# Patient Record
Sex: Male | Born: 1980 | Race: White | Hispanic: No | Marital: Single | Smoking: Never smoker
Health system: Southern US, Community
[De-identification: ages and names within clinical notes are randomized; demographics above are authoritative.]

---

## 2017-02-01 ENCOUNTER — Other Ambulatory Visit: Payer: Self-pay

## 2017-02-01 ENCOUNTER — Encounter (HOSPITAL_COMMUNITY): Payer: Self-pay | Admitting: Emergency Medicine

## 2017-02-01 ENCOUNTER — Emergency Department (HOSPITAL_COMMUNITY): Payer: Self-pay

## 2017-02-01 ENCOUNTER — Emergency Department (HOSPITAL_COMMUNITY)
Admission: EM | Admit: 2017-02-01 | Discharge: 2017-02-01 | Disposition: A | Discharge status: Home / Self Care | Payer: Self-pay | Attending: Emergency Medicine | Admitting: Emergency Medicine

## 2017-02-01 DIAGNOSIS — X509XXA Other and unspecified overexertion or strenuous movements or postures, initial encounter: Secondary | ICD-10-CM | POA: Insufficient documentation

## 2017-02-01 DIAGNOSIS — Y92002 Bathroom of unspecified non-institutional (private) residence single-family (private) house as the place of occurrence of the external cause: Secondary | ICD-10-CM | POA: Insufficient documentation

## 2017-02-01 DIAGNOSIS — M79602 Pain in left arm: Secondary | ICD-10-CM | POA: Insufficient documentation

## 2017-02-01 DIAGNOSIS — Y93E1 Activity, personal bathing and showering: Secondary | ICD-10-CM | POA: Insufficient documentation

## 2017-02-01 DIAGNOSIS — Y999 Unspecified external cause status: Secondary | ICD-10-CM | POA: Insufficient documentation

## 2017-02-01 LAB — BASIC METABOLIC PANEL
Anion gap: 13 (ref 5–15)
BUN: 15 mg/dL (ref 6–20)
CO2: 22 mmol/L (ref 22–32)
Calcium: 10.1 mg/dL (ref 8.9–10.3)
Chloride: 101 mmol/L (ref 101–111)
Creatinine, Ser: 1.18 mg/dL (ref 0.61–1.24)
GFR calc Af Amer: >60 mL/min (ref >60)
GFR calc non Af Amer: >60 mL/min (ref >60)
Glucose, Bld: 115 mg/dL — ABNORMAL HIGH (ref 65–99)
Potassium: 4.2 mmol/L (ref 3.5–5.1)
Sodium: 136 mmol/L (ref 135–145)

## 2017-02-01 LAB — I-STAT TROPONIN, ED: Troponin i, poc: 0 ng/mL (ref 0.00–0.08)

## 2017-02-01 LAB — CBC
HCT: 48.5 % (ref 39.0–52.0)
Hemoglobin: 17 g/dL (ref 13.0–17.0)
MCH: 29.3 pg (ref 26.0–34.0)
MCHC: 35.1 g/dL (ref 30.0–36.0)
MCV: 83.5 fL (ref 78.0–100.0)
Platelets: 226 10*3/uL (ref 150–400)
RBC: 5.81 MIL/uL (ref 4.22–5.81)
RDW: 12.7 % (ref 11.5–15.5)
WBC: 14.6 10*3/uL — ABNORMAL HIGH (ref 4.0–10.5)

## 2017-02-01 NOTE — ED Provider Notes (Signed)
[facility]GENC[facility] Provider Note   CSN: [id number] Arrival date & time: 02/01/17  1339     History   Chief Complaint Chief Complaint  Patient presents with  . Arm Pain    HPI Duwayne Heckndrew Sarria is a 36 y.o. male.  HPI   36 year old male presents today with complaints of arm pain.  Patient reports a tightness in his left arm that has been constant over the last 3 days.  Patient reports that he was in the shower, washing when he felt a tightness in the shoulder and arm, with radiation to the back.  He denies any associated chest pain, shortness of breath, nausea.  Patient notes history of the same several years ago where he saw cardiologist and had a stress test that showed no significant findings.  He denies any significant personal cardiac history, history DVT or PE or any significant risk factors.  Patient denies any drug or alcohol use.  Patient notes the symptoms usually come when he is not getting enough rest, start with pain in the arms and then tightness in the arms.  He notes he does have tightness in the other arm occasionally as well.  The patient denies indigestion, denies any worsening with food.   History reviewed. No pertinent past medical history.  There are no active problems to display for this patient.   History reviewed. No pertinent surgical history.     Home Medications    Prior to Admission medications   Not on File    Family History No family history on file.  Social History Social History   Tobacco Use  . Smoking status: Never Smoker  . Smokeless tobacco: Never Used  Substance Use Topics  . Alcohol use: No    Frequency: Never  . Drug use: No     Allergies   Patient has no known allergies.   Review of Systems Review of Systems  All other systems reviewed and are negative.    Physical Exam Updated Vital Signs BP 132/85 (BP Location: Right Arm)   Pulse 78   Temp 97.9 F (36.6 C) (Oral)   Resp 16    SpO2 100%   Physical Exam  Constitutional: He is oriented to person, place, and time. He appears well-developed and well-nourished.  HENT:  Head: Normocephalic and atraumatic.  Eyes: Conjunctivae are normal. Pupils are equal, round, and reactive to light. Right eye exhibits no discharge. Left eye exhibits no discharge. No scleral icterus.  Neck: Normal range of motion. No JVD present. No tracheal deviation present.  Cardiovascular: Normal rate, regular rhythm, normal heart sounds and intact distal pulses. Exam reveals no gallop and no friction rub.  No murmur heard. Pulmonary/Chest: Effort normal and breath sounds normal. No stridor. No respiratory distress. He has no wheezes. He has no rales. He exhibits no tenderness.  Chest nontender to palpation  Abdominal: Soft. He exhibits no distension and no mass. There is no tenderness. There is no rebound and no guarding. No hernia.  Musculoskeletal:  Full active range of motion of bilateral upper extremities, nontender to palpation strength 5 out of 5, sensation intact.  Neurological: He is alert and oriented to person, place, and time. Coordination normal.  Psychiatric: He has a normal mood and affect. His behavior is normal. Judgment and thought content normal.  Nursing note and vitals reviewed.    ED Treatments / Results  Labs (all labs ordered are listed, but only abnormal results are displayed) Labs Reviewed  BASIC  METABOLIC PANEL - Abnormal; Notable for the following components:      Result Value   Glucose, Bld 115 (*)    All other components within normal limits  CBC - Abnormal; Notable for the following components:   WBC 14.6 (*)    All other components within normal limits  I-STAT TROPONIN, ED    EKG  EKG Interpretation None       Radiology Dg Chest 2 View  Result Date: 02/01/2017 CLINICAL DATA:  Left-sided chest pain radiating into left arm and back. EXAM: CHEST  2 VIEW COMPARISON:  None. FINDINGS: The heart size and  mediastinal contours are within normal limits. There is no evidence of pulmonary edema, consolidation, pneumothorax, nodule or pleural fluid. The visualized skeletal structures are unremarkable. IMPRESSION: No active cardiopulmonary disease. Electronically Signed   By: Irish LackGlenn  Yamagata M.D.   On: 02/01/2017 14:39    Procedures Procedures (including critical care time)  Medications Ordered in ED Medications - No data to display   Initial Impression / Assessment and Plan / ED Course  I have reviewed the triage vital signs and the nursing notes.  Pertinent labs & imaging results that were available during my care of the patient were reviewed by me and considered in my medical decision making (see chart for details).      Final Clinical Impressions(s) / ED Diagnoses   Final diagnoses:  Left arm pain   Labs:   Imaging: DG Chest  Consults:  Therapeutics:  Discharge Meds:   Assessment/Plan: 36 year old male presents today with vague complaints of arm pain.  This is not producible, he denies any chest pain shortness of breath, have very low suspicion for referred pain.  Patient questioning if this is related to his heart, he has a very reassuring workup for cardiac etiology, he has a very low risk profile for ACS, PERC negative.  Patient requesting outpatient follow-up, he will be given referral to cardiology as he is new to the area and does not have a cardiologist here.  Patient is given strict return precautions, verbalized understanding and agreement to today's plan had no further questions or concerns at discharge.      ED Discharge Orders    None       Rosalio LoudHedges, Keshan Reha, PA-C 02/01/17 2050    Raeford RazorKohut, Stephen, MD 02/07/17 [phone]

## 2017-02-01 NOTE — Discharge Instructions (Signed)
Please read attached information. If you experience any new or worsening signs or symptoms please return to the emergency room for evaluation. Please follow-up with your primary care provider or specialist as discussed.  °

## 2017-02-01 NOTE — ED Triage Notes (Addendum)
Pt reports L arm pressure and pain x 2 days that radiates to L axilla and L side of back. Pt reports "over-exerting himself just prior to this pain starting." Patient has full ROM, pulses strong bilaterally, grip strength equal. Resp e/u, skin warm/dry. When asked where pain moves to, patient points from L axilla to L upper chest. Pain and pressure is intermittent. Denies dizziness, no SOB.

## 2017-06-18 ENCOUNTER — Emergency Department (HOSPITAL_COMMUNITY): Payer: Self-pay

## 2017-06-18 ENCOUNTER — Encounter (HOSPITAL_COMMUNITY): Payer: Self-pay

## 2017-06-18 ENCOUNTER — Emergency Department (HOSPITAL_COMMUNITY)
Admission: EM | Admit: 2017-06-18 | Discharge: 2017-06-18 | Disposition: A | Discharge status: Home / Self Care | Payer: Self-pay | Attending: Emergency Medicine | Admitting: Emergency Medicine

## 2017-06-18 DIAGNOSIS — R0789 Other chest pain: Secondary | ICD-10-CM

## 2017-06-18 LAB — CBC
HEMATOCRIT: 47.9 % (ref 39.0–52.0)
Hemoglobin: 16.7 g/dL (ref 13.0–17.0)
MCH: 29.3 pg (ref 26.0–34.0)
MCHC: 34.9 g/dL (ref 30.0–36.0)
MCV: 84 fL (ref 78.0–100.0)
Platelets: 273 10*3/uL (ref 150–400)
RBC: 5.7 MIL/uL (ref 4.22–5.81)
RDW: 12.6 % (ref 11.5–15.5)
WBC: 11.9 10*3/uL — AB (ref 4.0–10.5)

## 2017-06-18 LAB — I-STAT TROPONIN, ED
Troponin i, poc: 0 ng/mL (ref 0.00–0.08)
Troponin i, poc: 0 ng/mL (ref 0.00–0.08)

## 2017-06-18 LAB — D-DIMER, QUANTITATIVE: D-Dimer, Quant: 0.55 ug/mL-FEU — ABNORMAL HIGH (ref 0.00–0.50)

## 2017-06-18 LAB — BASIC METABOLIC PANEL
Anion gap: 11 (ref 5–15)
BUN: 11 mg/dL (ref 6–20)
CHLORIDE: 102 mmol/L (ref 101–111)
CO2: 25 mmol/L (ref 22–32)
Calcium: 9.9 mg/dL (ref 8.9–10.3)
Creatinine, Ser: 1.05 mg/dL (ref 0.61–1.24)
GFR calc Af Amer: >60 mL/min (ref >60)
GFR calc non Af Amer: >60 mL/min (ref >60)
GLUCOSE: 111 mg/dL — AB (ref 65–99)
POTASSIUM: 3.6 mmol/L (ref 3.5–5.1)
SODIUM: 138 mmol/L (ref 135–145)

## 2017-06-18 MED ORDER — SODIUM CHLORIDE 0.9 % IV BOLUS
1000.0000 mL | Freq: Once | INTRAVENOUS | Status: AC
  Administered 2017-06-18: 1000 mL via INTRAVENOUS

## 2017-06-18 MED ORDER — IOPAMIDOL (ISOVUE-370) INJECTION 76%
INTRAVENOUS | Status: AC
  Administered 2017-06-18: 100 mL
  Filled 2017-06-18: qty 100

## 2017-06-18 NOTE — Discharge Instructions (Addendum)
Please call and follow up with a primary care provider for further evaluation of your condition. Return if you have any concerns.

## 2017-06-18 NOTE — ED Triage Notes (Signed)
Pt to ER for evaluation of left arm, back, and chest pain onset Friday. Reports believes he's having a "fast acting heart attack." states this pain began after lots of heavy lifting. States feels "stiff."

## 2017-06-18 NOTE — ED Notes (Signed)
Patient transported to CT 

## 2017-06-18 NOTE — ED Provider Notes (Signed)
[facility] EMERGENCY DEPARTMENT Provider Note   CSN: [id number] Arrival date & time: 06/18/17  1104     History   Chief Complaint Chief Complaint  Patient presents with  . Arm Pain  . Chest Pain    HPI Micheal Page is a 37 y.o. male.  HPI   37 year old male who is a non-smoker presenting today for evaluation of chest pain.  Patient mention for the past 10 days he has had recurrent discomfort in his chest.  He described as a tightness sensation across his chest wall, as well as his left upper arm and to the base of his neck.  This pain has been waxing waning and sometimes lasting for hours.  Pain seems to be present at rest but more noticeable with movement.  He has had similar symptoms like this in the past, especially in the past few years but never had for evaluation for it.  He denies any exertional chest pain, no associated lightheadedness, dizziness, diaphoresis, shortness of breath, or nausea.  Does report family history of cardiac disease but no premature cardiac death and no history of exertional syncope.  Symptoms still present at this time.  No prior history of PE or DVT, no recent surgery, prolonged bed rest, recent travel, leg swelling or calf pain.  Denies alcohol use of polysubstance use.  He does admits to perform some heavy lifting recently.  He also felt that his symptoms could be "mini heart attacks".  He does not have a primary care provider.  History reviewed. No pertinent past medical history.  There are no active problems to display for this patient.   No past surgical history on file.      Home Medications    Prior to Admission medications   Not on File    Family History No family history on file.  Social History Social History   Tobacco Use  . Smoking status: Never Smoker  . Smokeless tobacco: Never Used  Substance Use Topics  . Alcohol use: No    Frequency: Never  . Drug use: No     Allergies   Patient has no known  allergies.   Review of Systems Review of Systems  All other systems reviewed and are negative.    Physical Exam Updated Vital Signs BP 122/84 (BP Location: Right Arm)   Pulse (!) 110   Temp 98.8 F (37.1 C) (Oral)   Resp 18   SpO2 100%   Physical Exam  Constitutional: He is oriented to person, place, and time. He appears well-developed and well-nourished. No distress.  Patient appears mildly anxious but nontoxic in appearance  HENT:  Head: Atraumatic.  Eyes: Conjunctivae are normal.  Neck: Neck supple. No JVD present.  Cardiovascular: Intact distal pulses and normal pulses.  Tachycardia without murmur rubs or gallops  Pulmonary/Chest: Effort normal and breath sounds normal. He has no decreased breath sounds. He has no wheezes. He has no rales.  Abdominal: Soft. Bowel sounds are normal. There is no tenderness.  Musculoskeletal:       Right lower leg: He exhibits no edema.       Left lower leg: He exhibits no edema.  Neurological: He is alert and oriented to person, place, and time.  Skin: Capillary refill takes less than 2 seconds. No rash noted.  Psychiatric: He has a normal mood and affect.  Nursing note and vitals reviewed.    ED Treatments / Results  Labs (all labs ordered are listed, but only  abnormal results are displayed) Labs Reviewed  BASIC METABOLIC PANEL - Abnormal; Notable for the following components:      Result Value   Glucose, Bld 111 (*)    All other components within normal limits  CBC - Abnormal; Notable for the following components:   WBC 11.9 (*)    All other components within normal limits  D-DIMER, QUANTITATIVE (NOT AT [facility] - Abnormal; Notable for the following components:   D-Dimer, Quant 0.55 (*)    All other components within normal limits  I-STAT TROPONIN, ED  I-STAT TROPONIN, ED    EKG None  ED ECG REPORT   Date: 06/18/2017  Rate: 106  Rhythm: sinus tachycardia  QRS Axis: normal  Intervals: normal  ST/T Wave abnormalities:  nonspecific ST changes  Conduction Disutrbances:none  Narrative Interpretation:   Old EKG Reviewed: none available  I have personally reviewed the EKG tracing and agree with the computerized printout as noted.   Radiology Dg Chest 2 View  Result Date: 06/18/2017 CLINICAL DATA:  Left-sided chest pain 2 days. EXAM: CHEST - 2 VIEW COMPARISON:  02/01/2017 FINDINGS: Lungs are somewhat hyperexpanded without consolidation or effusion. Cardiomediastinal silhouette, bones and soft tissues are normal. IMPRESSION: No active cardiopulmonary disease. Electronically Signed   By: Elberta Fortis M.D.   On: 06/18/2017 12:15   Ct Angio Chest Pe W And/or Wo Contrast  Result Date: 06/18/2017 CLINICAL DATA:  Chest pain. EXAM: CT ANGIOGRAPHY CHEST WITH CONTRAST TECHNIQUE: Multidetector CT imaging of the chest was performed using the standard protocol during bolus administration of intravenous contrast. Multiplanar CT image reconstructions and MIPs were obtained to evaluate the vascular anatomy. CONTRAST:  ISOVUE-370 IOPAMIDOL (ISOVUE-370) INJECTION 76% COMPARISON:  Chest x-ray from same day. FINDINGS: Cardiovascular: Satisfactory opacification of the pulmonary arteries to the segmental level. No evidence of pulmonary embolism. Normal heart size. No pericardial effusion. Normal caliber thoracic aorta. Mediastinum/Nodes: No enlarged mediastinal, hilar, or axillary lymph nodes. Thyroid gland, trachea, and esophagus demonstrate no significant findings. Lungs/Pleura: Lungs are clear. No pleural effusion or pneumothorax. No suspicious pulmonary nodule. Upper Abdomen: No acute abnormality. Musculoskeletal: No chest wall abnormality. No acute or significant osseous findings. Review of the MIP images confirms the above findings. IMPRESSION: 1. Normal CT angiogram of the chest. Electronically Signed   By: Obie Dredge M.D.   On: 06/18/2017 17:57    Procedures Procedures (including critical care time)  Medications  Ordered in ED Medications  sodium chloride 0.9 % bolus 1,000 mL (0 mLs Intravenous Stopped 06/18/17 1721)  iopamidol (ISOVUE-370) 76 % injection (100 mLs  Contrast Given 06/18/17 1718)     Initial Impression / Assessment and Plan / ED Course  I have reviewed the triage vital signs and the nursing notes.  Pertinent labs & imaging results that were available during my care of the patient were reviewed by me and considered in my medical decision making (see chart for details).     BP (!) 132/92   Pulse (!) 108   Temp 98.8 F (37.1 C) (Oral)   Resp 18   SpO2 100%    Final Clinical Impressions(s) / ED Diagnoses   Final diagnoses:  Atypical chest pain    ED Discharge Orders    None     6:36 PM pt presents complaining of having recurrent "mini heart attacks" for several months worse with in the past 10 days.  He does not have any significant risk factor for ACS.  No significant risk for PE however he is  tachycardic.  Due to his recurrent concerned, chest CT angiogram obtained without any acute finding.  Labs are reassuring, normal delta troponin, no electrolyte derangement.  He did receive IV fluid but tachycardia persist likely due to his anxiety state.  Blood pressure within normal limits.  At this time, no acute emergent medical condition identified.  Encourage patient to follow-up with a primary care provider for further evaluation of his condition.  He may need his thyroid to be exam.  Return precautions discussed.   Fayrene Helper, PA-C 06/18/17 1846    Mancel Bale, MD 06/19/17 1343

## 2019-03-17 IMAGING — CT CT ANGIO CHEST
2 of 7 series · 19 of 46 positions shown · IV contrast (iopamidol)
Comparison: Chest x-ray from same day.

CLINICAL DATA: Chest pain.

EXAM:
CT ANGIOGRAPHY CHEST WITH CONTRAST
TECHNIQUE: Multidetector CT imaging of the chest was performed using the
standard protocol during bolus administration of intravenous
contrast. Multiplanar CT image reconstructions and MIPs were
obtained to evaluate the vascular anatomy.
CONTRAST:  100mL [id number] IOPAMIDOL ([id number]) INJECTION 76%

[Series 8: thins · axial · 0.71mm/px · z∈[+1222,+1513]mm · 16 of 470 slices shown]
[im 27/470  lung]
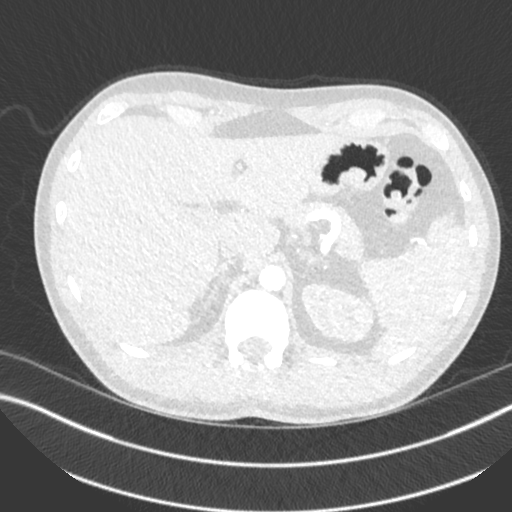
[im 53/470  soft-tissue]
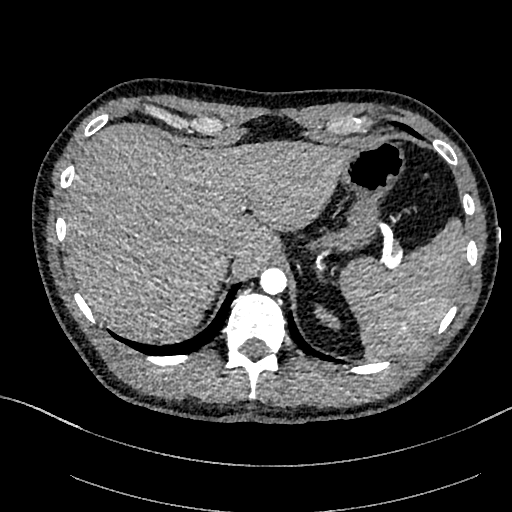
[im 79/470  lung]
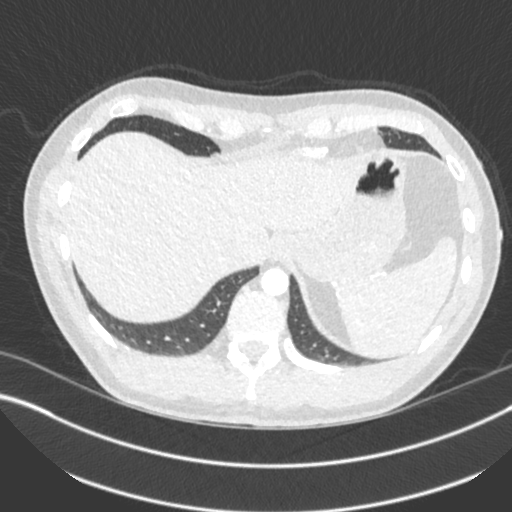
[im 105/470  soft-tissue]
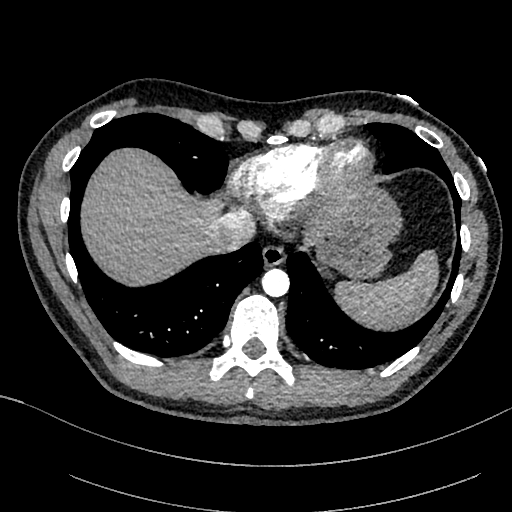
[im 131/470  lung]
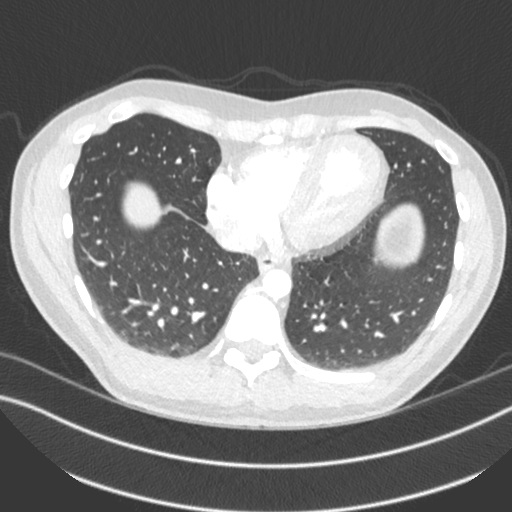
[im 157/470  soft-tissue]
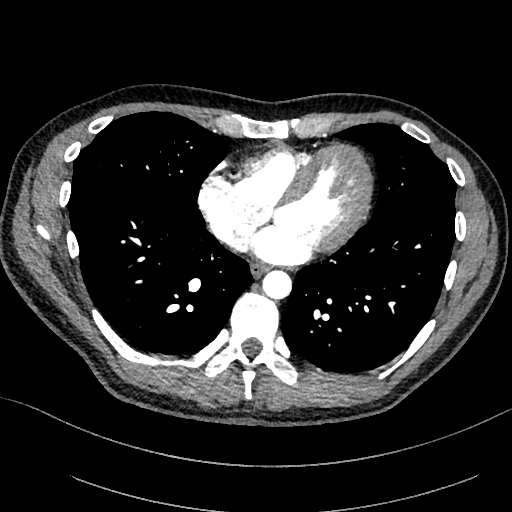
[im 183/470  lung]
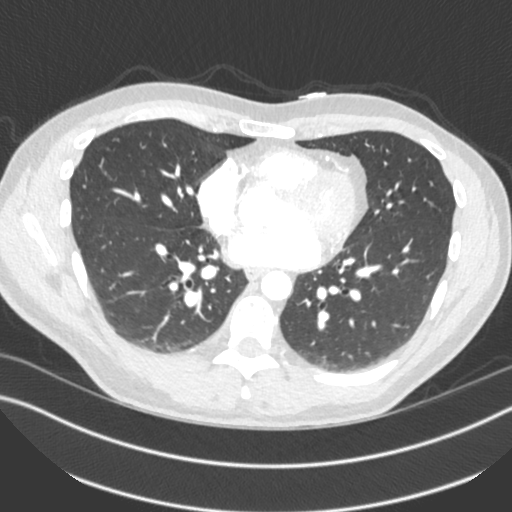
[im 209/470  soft-tissue]
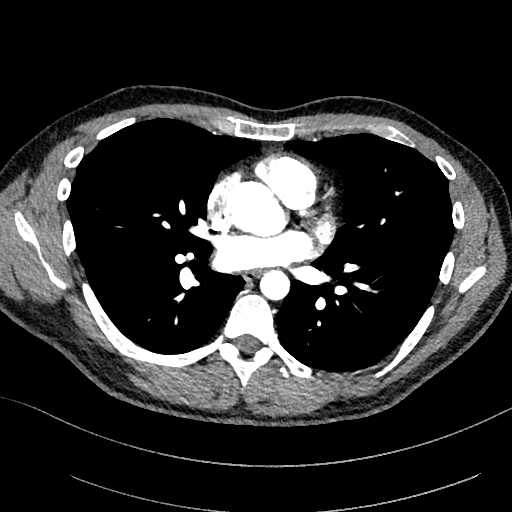
[im 261/470  lung]
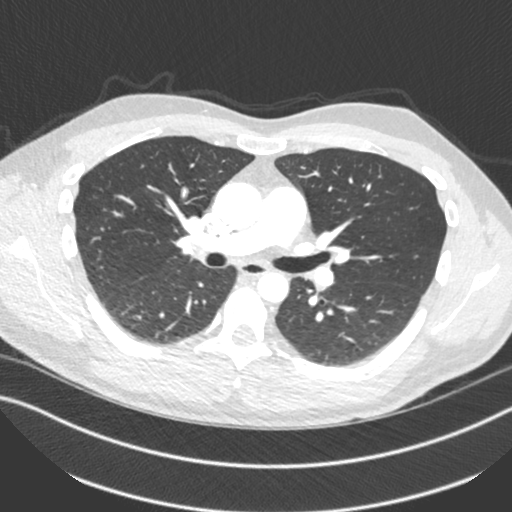
[im 287/470  soft-tissue]
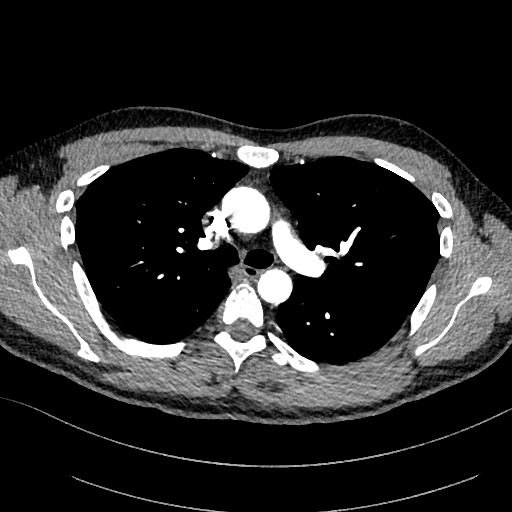
[im 313/470  lung]
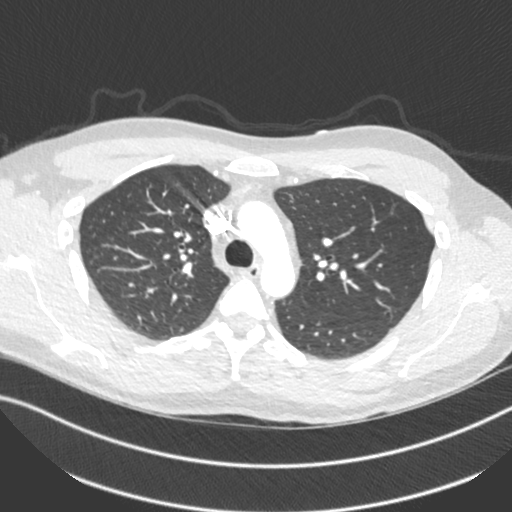
[im 339/470  soft-tissue]
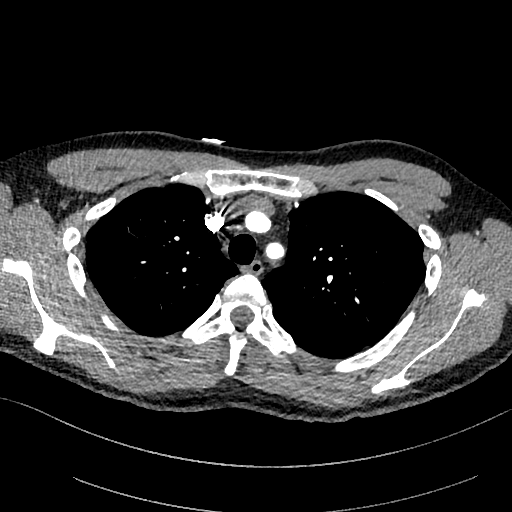
[im 365/470  lung]
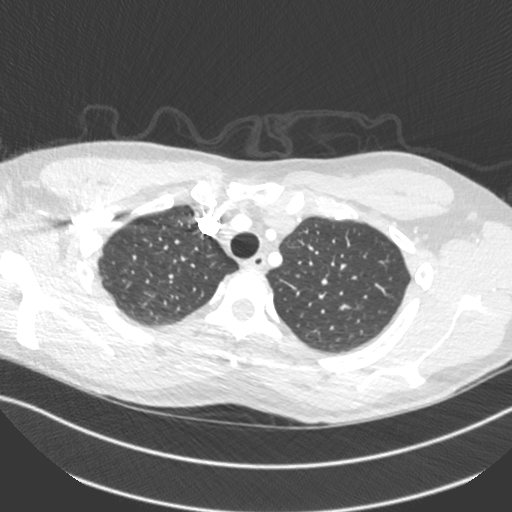
[im 391/470  soft-tissue]
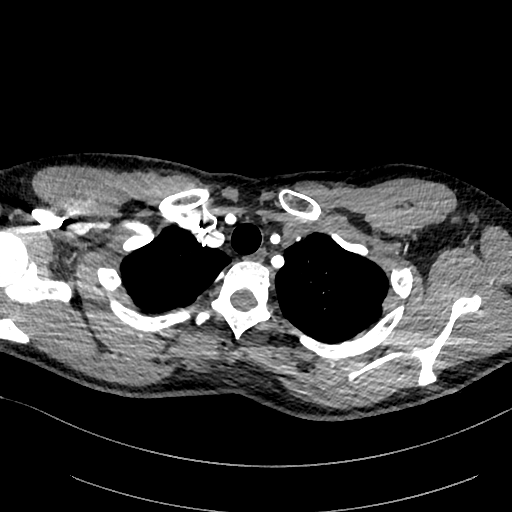
[im 417/470  lung]
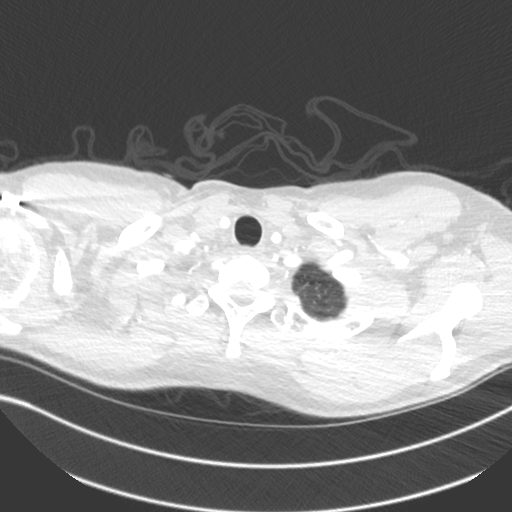
[im 443/470  soft-tissue]
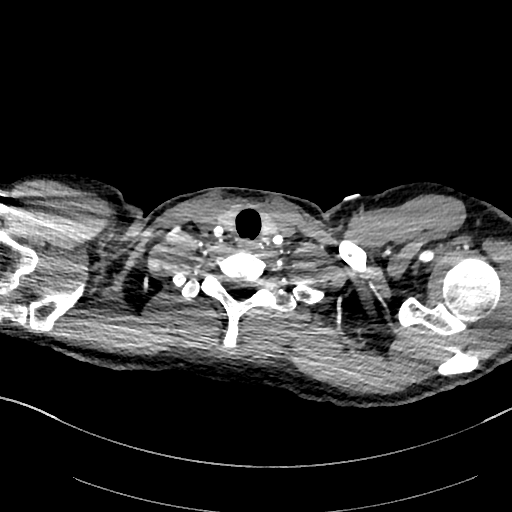

[Series 9: cor · coronal · 0.65mm/px · 3 of 150 slices shown]
[im 38/150  soft-tissue]
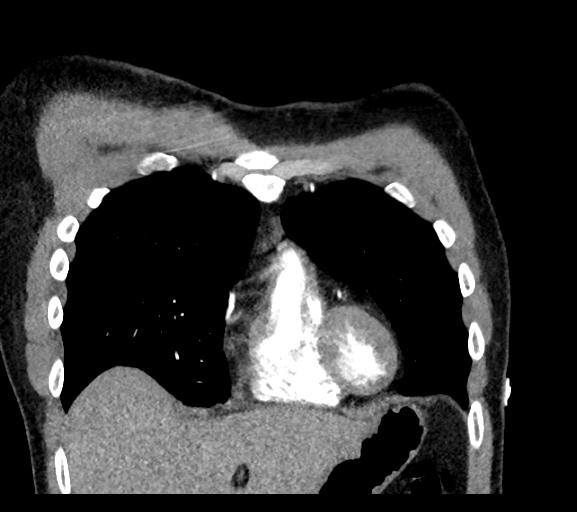
[im 75/150  soft-tissue]
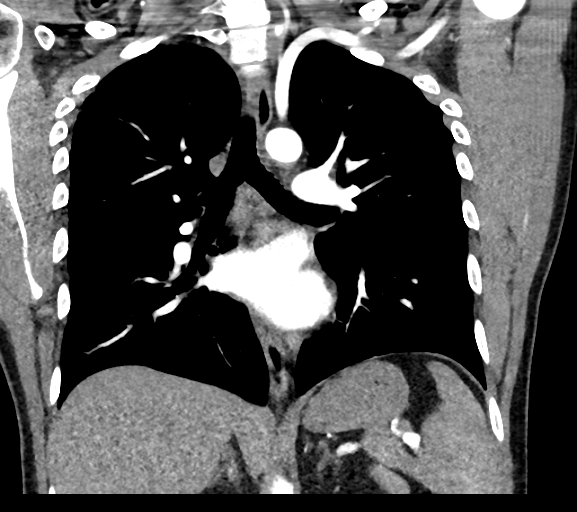
[im 112/150  soft-tissue]
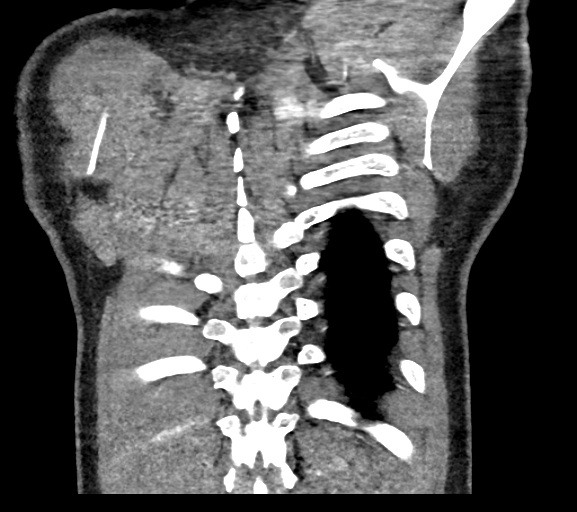

[19 of 46 positions shown; findings below may reference images not displayed]

FINDINGS: Cardiovascular: Satisfactory opacification of the pulmonary arteries
to the segmental level. No evidence of pulmonary embolism. Normal
heart size. No pericardial effusion. Normal caliber thoracic aorta.

Mediastinum/Nodes: No enlarged mediastinal, hilar, or axillary lymph
nodes. Thyroid gland, trachea, and esophagus demonstrate no
significant findings.

Lungs/Pleura: Lungs are clear. No pleural effusion or pneumothorax.
No suspicious pulmonary nodule.

Upper Abdomen: No acute abnormality.

Musculoskeletal: No chest wall abnormality. No acute or significant
osseous findings.

Review of the MIP images confirms the above findings.
IMPRESSION: 1. Normal CT angiogram of the chest.
